# Patient Record
Sex: Male | Born: 1990 | Race: Black or African American | Hispanic: No | Marital: Single | Smoking: Never smoker
Health system: Southern US, Community
[De-identification: ages and names within clinical notes are randomized; demographics above are authoritative.]

---
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
## 2008-02-19 ENCOUNTER — Emergency Department (HOSPITAL_COMMUNITY): Admission: EM | Admit: 2008-02-19 | Discharge: 2008-02-19 | Payer: Self-pay | Admitting: Emergency Medicine

## 2009-03-24 ENCOUNTER — Emergency Department (HOSPITAL_COMMUNITY): Admission: EM | Admit: 2009-03-24 | Discharge: 2009-03-24 | Payer: Self-pay | Admitting: Family Medicine

## 2009-09-20 ENCOUNTER — Emergency Department (HOSPITAL_COMMUNITY): Admission: EM | Admit: 2009-09-20 | Discharge: 2009-09-20 | Payer: Self-pay | Admitting: Emergency Medicine

## 2010-08-26 LAB — GC/CHLAMYDIA PROBE AMP, GENITAL: Chlamydia, DNA Probe: NEGATIVE

## 2010-09-25 ENCOUNTER — Emergency Department (HOSPITAL_COMMUNITY)
Admission: EM | Admit: 2010-09-25 | Discharge: 2010-09-25 | Disposition: A | Discharge status: Home / Self Care | Payer: Self-pay | Attending: Emergency Medicine | Admitting: Emergency Medicine

## 2010-09-25 DIAGNOSIS — S91009A Unspecified open wound, unspecified ankle, initial encounter: Secondary | ICD-10-CM | POA: Insufficient documentation

## 2010-09-25 DIAGNOSIS — S61409A Unspecified open wound of unspecified hand, initial encounter: Secondary | ICD-10-CM | POA: Insufficient documentation

## 2010-09-25 DIAGNOSIS — S31809A Unspecified open wound of unspecified buttock, initial encounter: Secondary | ICD-10-CM | POA: Insufficient documentation

## 2010-09-25 DIAGNOSIS — S81009A Unspecified open wound, unspecified knee, initial encounter: Secondary | ICD-10-CM | POA: Insufficient documentation

## 2010-09-25 DIAGNOSIS — W268XXA Contact with other sharp object(s), not elsewhere classified, initial encounter: Secondary | ICD-10-CM | POA: Insufficient documentation

## 2010-10-05 ENCOUNTER — Emergency Department (HOSPITAL_COMMUNITY)
Admission: EM | Admit: 2010-10-05 | Discharge: 2010-10-05 | Discharge status: Against Medical Advice | Payer: Federal, State, Local not specified - PPO | Attending: Emergency Medicine | Admitting: Emergency Medicine

## 2010-10-05 DIAGNOSIS — Z0389 Encounter for observation for other suspected diseases and conditions ruled out: Secondary | ICD-10-CM | POA: Insufficient documentation

## 2010-10-09 ENCOUNTER — Emergency Department (HOSPITAL_COMMUNITY)
Admission: EM | Admit: 2010-10-09 | Discharge: 2010-10-09 | Disposition: A | Discharge status: Home / Self Care | Payer: Federal, State, Local not specified - PPO | Attending: Emergency Medicine | Admitting: Emergency Medicine

## 2010-10-09 DIAGNOSIS — L089 Local infection of the skin and subcutaneous tissue, unspecified: Secondary | ICD-10-CM | POA: Insufficient documentation

## 2010-10-09 DIAGNOSIS — Z4802 Encounter for removal of sutures: Secondary | ICD-10-CM | POA: Insufficient documentation

## 2011-02-18 ENCOUNTER — Inpatient Hospital Stay (INDEPENDENT_AMBULATORY_CARE_PROVIDER_SITE_OTHER)
Admission: RE | Admit: 2011-02-18 | Discharge: 2011-02-18 | Disposition: A | Discharge status: Home / Self Care | Payer: Federal, State, Local not specified - PPO | Source: Ambulatory Visit | Attending: Emergency Medicine | Admitting: Emergency Medicine

## 2011-02-18 DIAGNOSIS — A549 Gonococcal infection, unspecified: Secondary | ICD-10-CM

## 2021-05-21 ENCOUNTER — Emergency Department (HOSPITAL_BASED_OUTPATIENT_CLINIC_OR_DEPARTMENT_OTHER)
Admission: EM | Admit: 2021-05-21 | Discharge: 2021-05-21 | Disposition: A | Discharge status: Home / Self Care | Payer: Self-pay | Attending: Emergency Medicine | Admitting: Emergency Medicine

## 2021-05-21 ENCOUNTER — Encounter (HOSPITAL_BASED_OUTPATIENT_CLINIC_OR_DEPARTMENT_OTHER): Payer: Self-pay

## 2021-05-21 ENCOUNTER — Other Ambulatory Visit: Payer: Self-pay

## 2021-05-21 ENCOUNTER — Emergency Department (HOSPITAL_BASED_OUTPATIENT_CLINIC_OR_DEPARTMENT_OTHER): Payer: Self-pay

## 2021-05-21 DIAGNOSIS — Z20822 Contact with and (suspected) exposure to covid-19: Secondary | ICD-10-CM | POA: Insufficient documentation

## 2021-05-21 DIAGNOSIS — R0602 Shortness of breath: Secondary | ICD-10-CM | POA: Insufficient documentation

## 2021-05-21 DIAGNOSIS — K5900 Constipation, unspecified: Secondary | ICD-10-CM | POA: Insufficient documentation

## 2021-05-21 DIAGNOSIS — B370 Candidal stomatitis: Secondary | ICD-10-CM | POA: Insufficient documentation

## 2021-05-21 DIAGNOSIS — R002 Palpitations: Secondary | ICD-10-CM | POA: Insufficient documentation

## 2021-05-21 LAB — CBC WITH DIFFERENTIAL/PLATELET
Abs Immature Granulocytes: 0.01 10*3/uL (ref 0.00–0.07)
Basophils Absolute: 0 10*3/uL (ref 0.0–0.1)
Basophils Relative: 1 %
Eosinophils Absolute: 0.1 10*3/uL (ref 0.0–0.5)
Eosinophils Relative: 1 %
HCT: 47.3 % (ref 39.0–52.0)
Hemoglobin: 16.3 g/dL (ref 13.0–17.0)
Immature Granulocytes: 0 %
Lymphocytes Relative: 28 %
Lymphs Abs: 1.8 10*3/uL (ref 0.7–4.0)
MCH: 28.2 pg (ref 26.0–34.0)
MCHC: 34.5 g/dL (ref 30.0–36.0)
MCV: 82 fL (ref 80.0–100.0)
Monocytes Absolute: 0.5 10*3/uL (ref 0.1–1.0)
Monocytes Relative: 8 %
Neutro Abs: 3.9 10*3/uL (ref 1.7–7.7)
Neutrophils Relative %: 62 %
Platelets: 168 10*3/uL (ref 150–400)
RBC: 5.77 MIL/uL (ref 4.22–5.81)
RDW: 12.6 % (ref 11.5–15.5)
WBC: 6.2 10*3/uL (ref 4.0–10.5)
nRBC: 0 % (ref 0.0–0.2)

## 2021-05-21 LAB — BASIC METABOLIC PANEL
Anion gap: 8 (ref 5–15)
BUN: 7 mg/dL (ref 6–20)
CO2: 25 mmol/L (ref 22–32)
Calcium: 9.2 mg/dL (ref 8.9–10.3)
Chloride: 102 mmol/L (ref 98–111)
Creatinine, Ser: 1.08 mg/dL (ref 0.61–1.24)
GFR, Estimated: >60 mL/min (ref >60)
Glucose, Bld: 87 mg/dL (ref 70–99)
Potassium: 3.5 mmol/L (ref 3.5–5.1)
Sodium: 135 mmol/L (ref 135–145)

## 2021-05-21 LAB — RESP PANEL BY RT-PCR (FLU A&B, COVID) ARPGX2
Influenza A by PCR: NEGATIVE
Influenza B by PCR: NEGATIVE
SARS Coronavirus 2 by RT PCR: NEGATIVE

## 2021-05-21 LAB — TSH: TSH: 0.955 u[IU]/mL (ref 0.350–4.500)

## 2021-05-21 LAB — D-DIMER, QUANTITATIVE: D-Dimer, Quant: <0.27 ug/mL-FEU (ref 0.00–0.50)

## 2021-05-21 MED ORDER — CLOTRIMAZOLE 10 MG MT TROC: 10.0000 mg | Freq: Every day | OROMUCOSAL | 0 refills | Status: AC

## 2021-05-21 MED ORDER — POLYETHYLENE GLYCOL 3350 17 G PO PACK: 17.0000 g | PACK | Freq: Every day | ORAL | 0 refills | Status: AC

## 2021-05-21 NOTE — ED Provider Notes (Signed)
[facility] EMERGENCY DEPARTMENT Provider Note   CSN: [id number] Arrival date & time: 05/21/21  1041     History Chief Complaint  Patient presents with   Palpitations    Fernando Hayes is a 30 y.o. male with no reported past medical history.  Presents the emergency department with a chief complaint of palpitations, fatigue, shortness of breath, and constipation.  Patient states that symptoms have been present over the last 5 days.  Patient reports that his palpitations will, on randomly and last for approximately 1 hour before resolving.  Patient reports associated shortness of breath with these palpitations.  Denies any associated chest pain, lightheadedness, dizziness, or syncope.  Patient reports that he has had generalized fatigue over the last 5 days as well.  Patient reports that he had a root canal procedure performed approximately 2 weeks prior.  States that he did not complete his antibiotic treatment.  Patient denies any fever, chills, rhinorrhea, nasal congestion, sore throat, cough, abdominal pain, nausea, vomiting, diarrhea, blood in stool, melena, dysuria, hematuria, urinary urgency, hemoptysis, leg swelling or tenderness, history of DVT or PE, surgery in the last 4 weeks, history of malignancy, history of abdominal surgery.   Palpitations Associated symptoms: shortness of breath   Associated symptoms: no back pain, no chest pain, no dizziness, no nausea and no vomiting       History reviewed. No pertinent past medical history.  There are no problems to display for this patient.   History reviewed. No pertinent surgical history.     History reviewed. No pertinent family history.  Social History   Tobacco Use   Smoking status: Never   Smokeless tobacco: Never  Vaping Use   Vaping Use: Never used  Substance Use Topics   Alcohol use: Not Currently   Drug use: Yes    Types: Marijuana    Home Medications Prior to Admission medications   Not on  File    Allergies    Penicillins  Review of Systems   Review of Systems  Constitutional:  Positive for fatigue. Negative for chills and fever.  HENT:  Negative for congestion, rhinorrhea and sore throat.   Eyes:  Negative for visual disturbance.  Respiratory:  Positive for shortness of breath.   Cardiovascular:  Positive for palpitations. Negative for chest pain and leg swelling.  Gastrointestinal:  Positive for constipation. Negative for abdominal distention, abdominal pain, anal bleeding, blood in stool, diarrhea, nausea, rectal pain and vomiting.  Genitourinary:  Negative for difficulty urinating, dysuria and hematuria.  Musculoskeletal:  Negative for back pain and neck pain.  Skin:  Negative for color change and rash.  Neurological:  Negative for dizziness, syncope, light-headedness and headaches.  Psychiatric/Behavioral:  Negative for confusion.    Physical Exam Updated Vital Signs BP 132/83    Pulse 83    Temp 98.3 F (36.8 C) (Oral)    Resp 14    Ht 6\' 4"  (1.93 m)    Wt 106.1 kg    SpO2 99%    BMI 28.48 kg/m   Physical Exam Vitals and nursing note reviewed.  Constitutional:      General: He is not in acute distress.    Appearance: He is not ill-appearing, toxic-appearing or diaphoretic.  HENT:     Head: Normocephalic.     Jaw: No trismus, tenderness, swelling, pain on movement or malocclusion.     Mouth/Throat:     Lips: Pink. No lesions.     Mouth: Mucous membranes are moist.  Tongue: Lesions present. Tongue does not deviate from midline.     Palate: No mass and lesions.     Pharynx: Oropharynx is clear. Uvula midline. No pharyngeal swelling, oropharyngeal exudate, posterior oropharyngeal erythema or uvula swelling.     Tonsils: No tonsillar exudate or tonsillar abscesses. 1+ on the right. 1+ on the left.     Comments: Thick white plaques noted to patient's tongue Eyes:     General: No scleral icterus.       Right eye: No discharge.        Left eye: No  discharge.  Cardiovascular:     Rate and Rhythm: Normal rate.     Pulses:          Radial pulses are 2+ on the right side and 2+ on the left side.     Heart sounds: Normal heart sounds. Heart sounds not distant. No murmur heard. Pulmonary:     Effort: Pulmonary effort is normal.     Breath sounds: Normal breath sounds.  Abdominal:     General: Abdomen is flat. Bowel sounds are normal. There is no distension. There are no signs of injury.     Palpations: Abdomen is soft. There is no mass or pulsatile mass.     Tenderness: There is no abdominal tenderness. There is no guarding or rebound.  Musculoskeletal:     Cervical back: Full passive range of motion without pain, normal range of motion and neck supple. No edema, erythema, signs of trauma, rigidity, torticollis or crepitus. No pain with movement, spinous process tenderness or muscular tenderness. Normal range of motion.     Right lower leg: Normal.     Left lower leg: Normal.  Lymphadenopathy:     Cervical: No cervical adenopathy.  Skin:    General: Skin is warm and dry.  Neurological:     General: No focal deficit present.     Mental Status: He is alert.  Psychiatric:        Behavior: Behavior is cooperative.    ED Results / Procedures / Treatments   Labs (all labs ordered are listed, but only abnormal results are displayed) Labs Reviewed  RESP PANEL BY RT-PCR (FLU A&B, COVID) [id number]  CULTURE, BLOOD (ROUTINE X 2)  CULTURE, BLOOD (ROUTINE X 2)  BASIC METABOLIC PANEL  CBC WITH DIFFERENTIAL/PLATELET  D-DIMER, QUANTITATIVE  TSH    EKG EKG Interpretation  Date/Time:  Thursday May 21 2021 10:51:02 EST Ventricular Rate:  94 PR Interval:  141 QRS Duration: 94 QT Interval:  343 QTC Calculation: 429 R Axis:   71 Text Interpretation: Sinus rhythm Borderline repolarization abnormality Borderline ST elevation, anterior leads No old tracing to compare Confirmed by Aletta Edouard [phone]) on 05/21/2021 11:03:43  AM  Radiology DG Chest 2 View  Result Date: 05/21/2021 CLINICAL DATA:  Shortness of breath EXAM: CHEST - 2 VIEW COMPARISON:  None. FINDINGS: Cardiomediastinal contours are within normal limits. Possible minimal patchy density at the left lung base. No pleural effusion or pneumothorax. No acute osseous abnormality. IMPRESSION: Possible minimal atelectasis/consolidation at the left lung base. Electronically Signed   By: Macy Mis M.D.   On: 05/21/2021 12:35    Procedures Procedures   Medications Ordered in ED Medications - No data to display  ED Course  I have reviewed the triage vital signs and the nursing notes.  Pertinent labs & imaging results that were available during my care of the patient were reviewed by me and considered in my medical decision making (  see chart for details).    MDM Rules/Calculators/A&P                          Alert 30 year old male no acute stress, nontoxic-appearing.  Presents to ED with chief complaint of fatigue, palpitations, shortness of breath and constipation.  Abdomen soft, nondistended, nontender.  No guarding or rebound tenderness.  Patient has no history of abdominal surgery.  Low suspicion for small bowel obstruction at this time.  Plan to prescribe patient with MiraLAX to help with his constipation.  Due to patient's reports of palpitations will obtain EKG, basic lab work, D-dimer, and chest x-ray.  Patient denies any chest pain or discomfort, suspicion for ACS at this time  D-dimer within normal limits, patient low risk for left ear for PE.  Low suspicion for PE at this time  Due to patient's recent dental infection and endocarditis was considered.  Lower suspicion at this time as patient has no leukocytosis and is afebrile.  Blood cultures were obtained and if positive further evaluation for endocarditis should be considered.  No signs of anemia as source of patient's symptoms.  BMP is unremarkable  TSH pending at this time and will  not be resulted today due to send out lab.  We will have [facility] for follow-up of this pending lab test.  COVID-19 and influenza testing pending at this time.  Patient given her need to follow-up results on [facility] MyChart.  Patient has no further episodes of palpitations while in the emergency department.  Will place ambulatory referral for cardiology for further work-up of his palpitations in outpatient setting.  Discussed results, findings, treatment and follow up. Patient advised of return precautions. Patient verbalized understanding and agreed with plan.        Final Clinical Impression(s) / ED Diagnoses Final diagnoses:  Palpitations  Oral thrush    Rx / DC Orders ED Discharge Orders          Ordered    clotrimazole (MYCELEX) 10 MG troche  5 times daily        05/21/21 1229             Berneice Heinrich 05/21/21 1258    Terrilee Files, MD 05/21/21 1754

## 2021-05-21 NOTE — Discharge Instructions (Addendum)
You came to the emergency department today to be evaluated for your palpitations.  Your physical exam and lab work were reassuring.  Due to your palpitations I have placed a referral for cardiology, you will be contacted in the next 2-3 business days to schedule a follow-up appointment.  Under exam you are found to have oral thrush.  Due to this I have started you on a medication clotrimazole.  Please take this medication as prescribed over the next 7 days.  For your constipation I have given you prescription for MiraLAX.  Please take this medication as prescribed.  Your TSH lab work is pending at this time.  Please follow-up with the [facility] for primary care and follow-up of this lab test  Your COVID-19 and influenza testing is pending at this time.  You can find results on your [facility] MyChart.  If positive you will need to self isolate for 5 days from your symptom onset.  You would currently be at day 4 based on your symptoms.  Get help right away if: You have chest pain or shortness of breath. You have a severe headache. You feel dizzy or you faint.

## 2021-05-21 NOTE — ED Triage Notes (Signed)
Pt states he has had heart palpitations x 5 days with fatigue, shortness of breath. States recently had root canal to tooth 2 weeks ago, states didn't finish antibiotics.

## 2021-05-26 LAB — CULTURE, BLOOD (ROUTINE X 2)
Culture: NO GROWTH
Culture: NO GROWTH
Specimen Description: ADEQUATE
